# Patient Record
Sex: Female | Born: 1995 | Race: Black or African American | Hispanic: No | Marital: Single | Smoking: Never smoker
Health system: Southern US, Community
[De-identification: ages and names within clinical notes are randomized; demographics above are authoritative.]

## PROBLEM LIST (undated history)

## (undated) ENCOUNTER — Inpatient Hospital Stay (HOSPITAL_COMMUNITY): Payer: Self-pay

## (undated) DIAGNOSIS — Z789 Other specified health status: Secondary | ICD-10-CM

## (undated) HISTORY — PX: NO PAST SURGERIES: SHX2092

---
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
## 2013-09-30 ENCOUNTER — Inpatient Hospital Stay (HOSPITAL_COMMUNITY)
Admission: RE | Admit: 2013-09-30 | Discharge: 2013-09-30 | Disposition: A | Discharge status: Home / Self Care | Payer: Medicaid Other | Source: Ambulatory Visit | Attending: Obstetrics & Gynecology | Admitting: Obstetrics & Gynecology

## 2013-09-30 DIAGNOSIS — Z3202 Encounter for pregnancy test, result negative: Secondary | ICD-10-CM | POA: Insufficient documentation

## 2013-09-30 LAB — URINALYSIS, ROUTINE W REFLEX MICROSCOPIC
BILIRUBIN URINE: NEGATIVE
Glucose, UA: NEGATIVE mg/dL
HGB URINE DIPSTICK: NEGATIVE
KETONES UR: 15 mg/dL — AB
NITRITE: NEGATIVE
PH: 6 (ref 5.0–8.0)
Protein, ur: NEGATIVE mg/dL
Specific Gravity, Urine: 1.025 (ref 1.005–1.030)
Urobilinogen, UA: 1 mg/dL (ref 0.0–1.0)

## 2013-09-30 LAB — URINE MICROSCOPIC-ADD ON

## 2013-09-30 LAB — POCT PREGNANCY, URINE: Preg Test, Ur: NEGATIVE

## 2013-09-30 NOTE — MAU Provider Note (Signed)
  History     CSN: [id number]  Arrival date and time: 09/30/13 1524   None     Chief Complaint  Patient presents with  . Possible Pregnancy   Possible Pregnancy    Carrie Robles is a 18 y.o. G1P1001 who presents today for a pregnancy test. She has not other concerns at this time.   Past Medical History  Diagnosis Date  . Medical history non-contributory     Past Surgical History  Procedure Laterality Date  . No past surgeries      No family history on file.  History  Substance Use Topics  . Smoking status: Never Smoker   . Smokeless tobacco: Never Used  . Alcohol Use: No    Allergies: No Known Allergies  Prescriptions prior to admission  Medication Sig Dispense Refill  . ibuprofen (ADVIL,MOTRIN) 600 MG tablet Take 1 tablet (600 mg total) by mouth every 6 (six) hours.  30 tablet  0    ROS Physical Exam   Blood pressure 118/67, pulse 92, temperature 97.7 F (36.5 C), temperature source Oral, resp. rate 18, last menstrual period 09/21/2013, unknown if currently breastfeeding.  Physical Exam  Nursing note and vitals reviewed. Constitutional: She is oriented to person, place, and time. She appears well-developed and well-nourished. No distress.  Cardiovascular: Normal rate.   Respiratory: Effort normal.  Neurological: She is alert and oriented to person, place, and time.  Skin: Skin is warm and dry.  Psychiatric: She has a normal mood and affect.    MAU Course  Procedures  Results for orders placed during the hospital encounter of 09/30/13 (from the past 24 hour(s))  POCT PREGNANCY, URINE     Status: None   Collection Time    09/30/13  3:54 PM      Result Value Ref Range   Preg Test, Ur NEGATIVE  NEGATIVE     Assessment and Plan   1. Encounter for pregnancy test with result negative    Discussed home pregnancy tests and when to use Return to MAU as needed  Follow-up Information   [facility] [place]. (As needed)    Contact information:   [address] [place] [id number] [phone]      Tawnya CrookHogan, [name] 09/30/2013, 3:59 PM

## 2013-09-30 NOTE — Discharge Instructions (Signed)
Pregnancy Tests Pregnancy tests are usually available at most drug stores and even the [employer] HOW DO PREGNANCY TESTS WORK? All pregnancy tests look for a special hormone in the urine or blood that is only present in pregnant women. This hormone, human chorionic gonadotropin (hCG), is also called the pregnancy hormone.  WHAT IS THE DIFFERENCE BETWEEN A URINE AND A BLOOD PREGNANCY TEST? IS ONE BETTER THAN THE OTHER? There are two types of pregnancy tests.  Blood tests.  Urine tests. Both tests look for the presence of hCG, the pregnancy hormone. Many women use a urine test or home pregnancy test (HPT) to find out if they are pregnant. HPTs are cheap, easy to use, can be done at home, and are private. When a woman has a positive result on an HPT, she needs to see her caregiver right away. The caregiver can confirm a positive HPT result with another urine test, a blood test, ultrasound, and a pelvic exam.  There are two types of blood tests you can get from a caregiver.   A quantitative blood test (or the beta hCG test). This test measures the exact amount of hCG in the blood. This means it can pick up very small amounts of hCG, making it a very accurate test.  A qualitative hCG blood test. This test gives a simple yes or no answer to whether you are pregnant. This test is more like a urine test in terms of its accuracy. Blood tests can pick up hCG earlier in a pregnancy than urine tests can. Blood tests can tell if you are pregnant about 6 to 8 days after you release an egg from an ovary (ovulate). Urine tests can determine pregnancy about 2 weeks after ovulation.  HOW IS A HOME PREGNANCY TEST DONE?  There are many types of home pregnancy tests or HPTs that can be bought over-the-counter at drug or discount stores.   Some involve collecting your urine in a cup and dipping a stick into the urine or putting some of the urine into a special container with an eyedropper.  Others are done by  placing a stick into your urine stream.  Tests vary in how long you need to wait for the stick or container to turn a certain color or have a symbol on it (like a plus or a minus).  All tests come with written instructions. Most tests also have toll-free phone numbers to call if you have any questions about how to do the test or read the results. HOW ACCURATE ARE HOME PREGNANCY TESTS?  HPTs are very accurate. Most brands of HPTs say they are 97% to 99% accurate when taken 1 week after missing your menstrual period, but this can vary with actual use. Each brand varies in how sensitive it is in picking up the pregnancy hormone hCG. If a test is not done correctly, it will be less accurate. Always check the package to make sure it is not past its expiration date. If it is, it will not be accurate. Most brands of HPTs tell users to do the test again in a few days, no matter what the results.  If you use an HPT too early in your pregnancy, you may not have enough of the pregnancy hormone hCG in your urine to have a positive test result. Most HPTs will be accurate if you test yourself around the time your period is due (about 2 weeks after you ovulate). You can get a negative test result if  you are not pregnant or if you ovulated later than you thought you did. You may also have problems with the pregnancy, which affects the amount of hCG you have in your urine. If your HPT is negative, test yourself again within a few days to 1 week. If you keep getting a negative result and think you are pregnant, talk with your caregiver right away about getting a blood pregnancy test.  FALSE POSITIVE PREGNANCY TEST A false positive HPT can happen if there is blood or protein present in your urine. A false positive can also happen if you were recently pregnant or if you take a pregnancy test too soon after taking fertility drug that contains hCG. Also, some prescription medicines such as water pills (diuretics), tranquilizers,  seizure medicines, psychiatric medicines, and allergy and nausea medicines (promethazine) give false positive readings. FALSE NEGATIVE PREGNANCY TEST  A false negative HPT can happen if you do the test too early. Try to wait until you are at least 1 day late for your menstrual period.  It may happen if you wait too long to test the urine (longer than 15 minutes).  It may also happen if the urine is too diluted because you drank a lot of fluids before getting the urine sample. It is best to test the first morning urine after you get out of bed. If your menstrual period did not start after a week of a negative HPT, repeat the pregnancy test. CAN ANYTHING INTERFERE WITH HOME PREGNANCY TEST RESULTS?  Most medicines, both over-the-counter and prescription drugs, including birth control pills and antibiotics, should not affect the results of a HPT. Only those drugs that have the pregnancy hormone hCG in them can give a false positive test result. Drugs that have hCG in them may be used for treating infertility (not being able to get pregnant). Alcohol and illegal drugs do not affect HPT results, but you should not be using these substances if you are trying to get pregnant. If you have a positive pregnancy test, call your caregiver to make an appointment to begin prenatal care. Document Released: 03/08/2003 Document Revised: 05/28/2011 Document Reviewed: 05/19/2010 [facility] Patient Information 2015 [place], [place]. This information is not intended to replace advice given to you by your health care provider. Make sure you discuss any questions you have with your health care provider.

## 2013-09-30 NOTE — MAU Note (Signed)
C/O fatigue, sleeping a lot, needs a pregnancy test.  Last period was shorter than usual.

## 2013-09-30 NOTE — MAU Provider Note (Signed)
Attestation of Attending Supervision of Advanced Practitioner (CNM/NP): Evaluation and management procedures were performed by the Advanced Practitioner under my supervision and collaboration.  I have reviewed the Advanced Practitioner's note and chart, and I agree with the management and plan.  HARRAWAY-SMITH, Patriece Archbold 4:25 PM     

## 2014-01-18 ENCOUNTER — Encounter (HOSPITAL_COMMUNITY): Payer: Self-pay | Admitting: *Deleted

## 2014-08-26 IMAGING — US US OB COMP +14 WK
1 series · 13 of 28 positions shown · non-contrast
Comparison: none

[Series 1: us ob comp +14 wk · 13 of 28 slices shown]
[im 2/28]
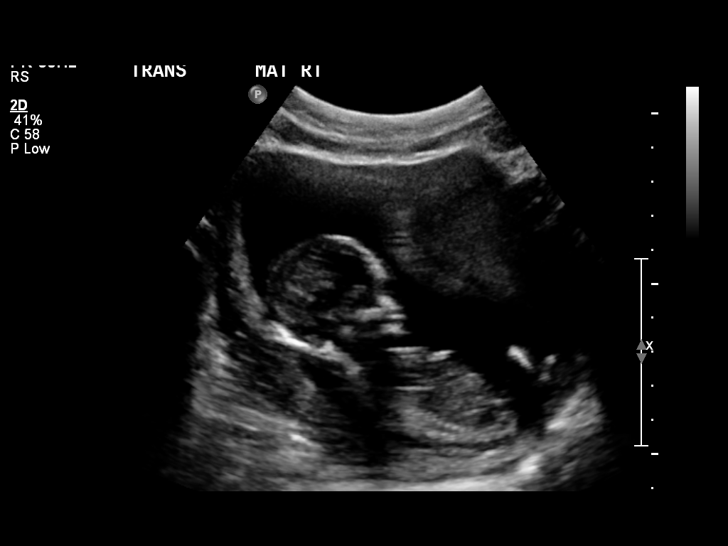
[im 4/28]
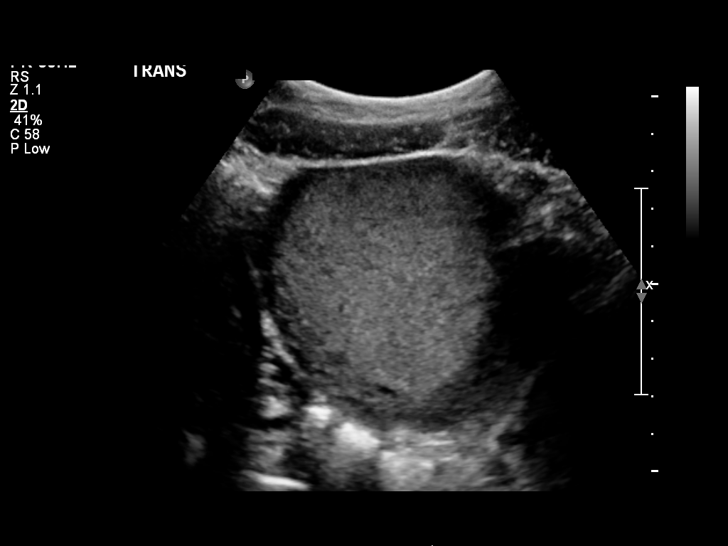
[im 6/28]
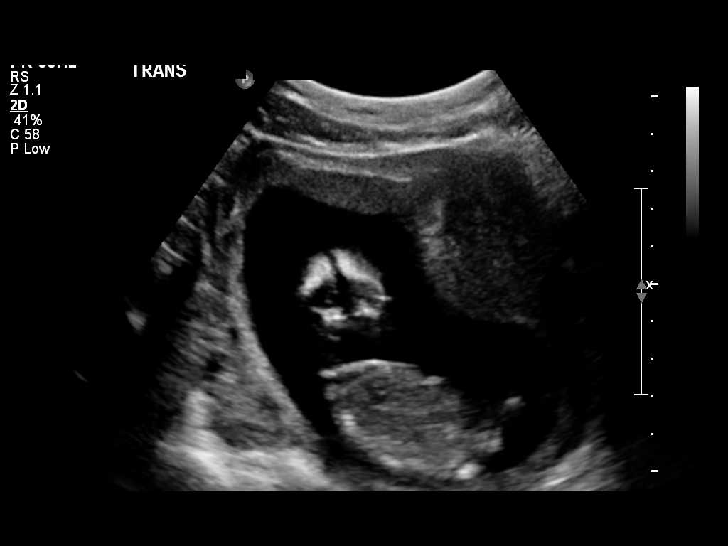
[im 8/28]
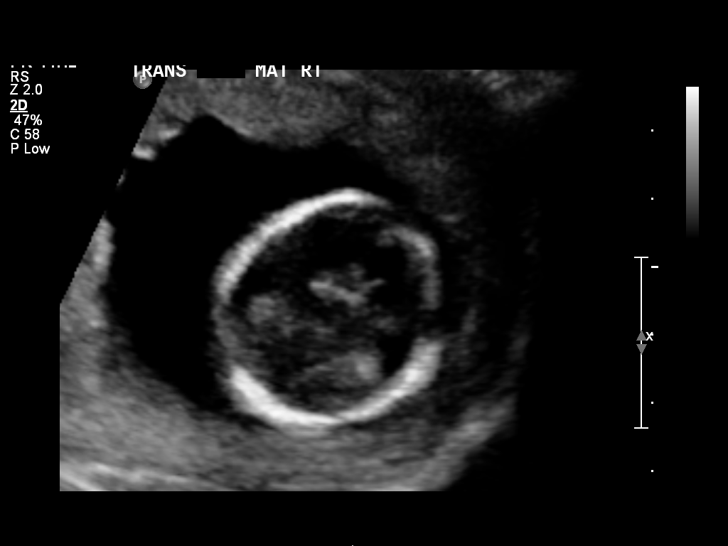
[im 10/28]
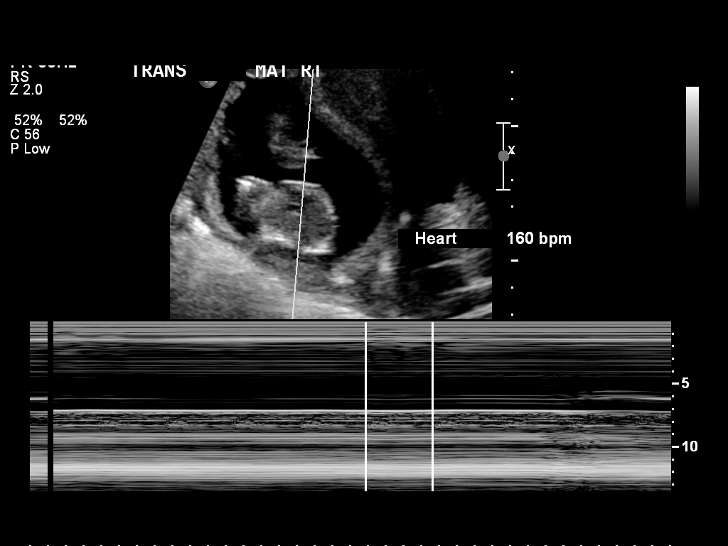
[im 12/28]
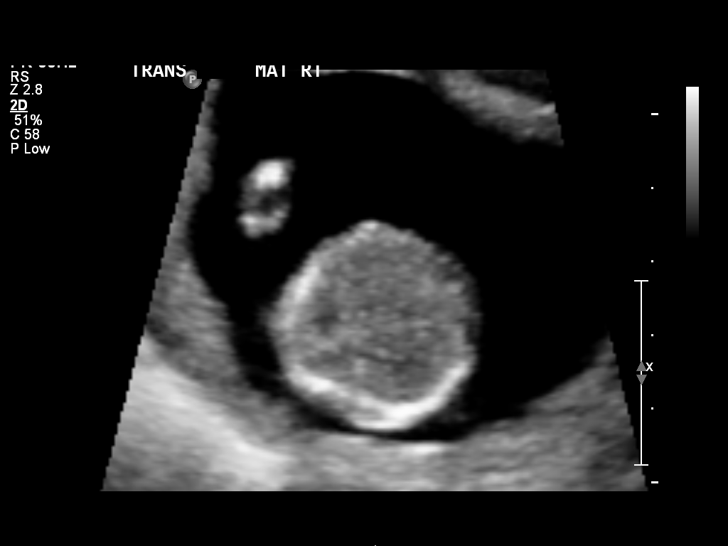
[im 15/28]
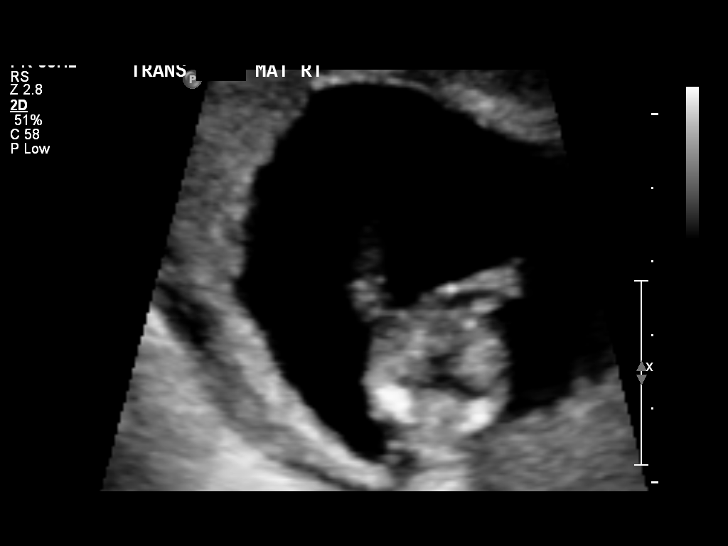
[im 17/28]
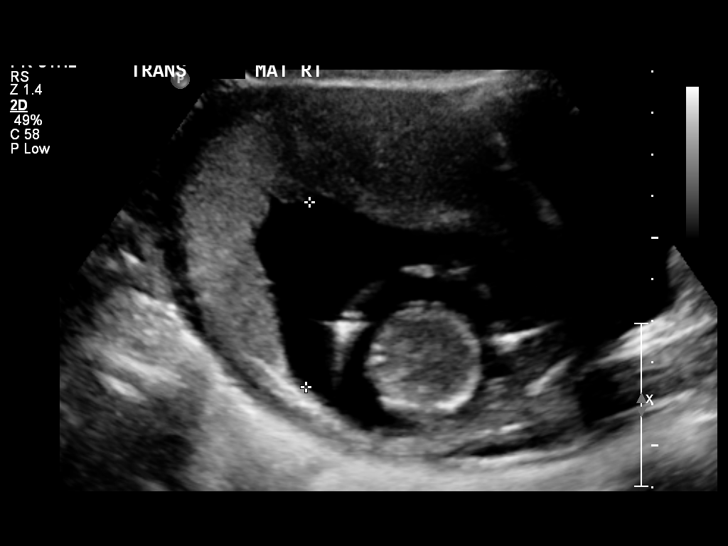
[im 19/28]
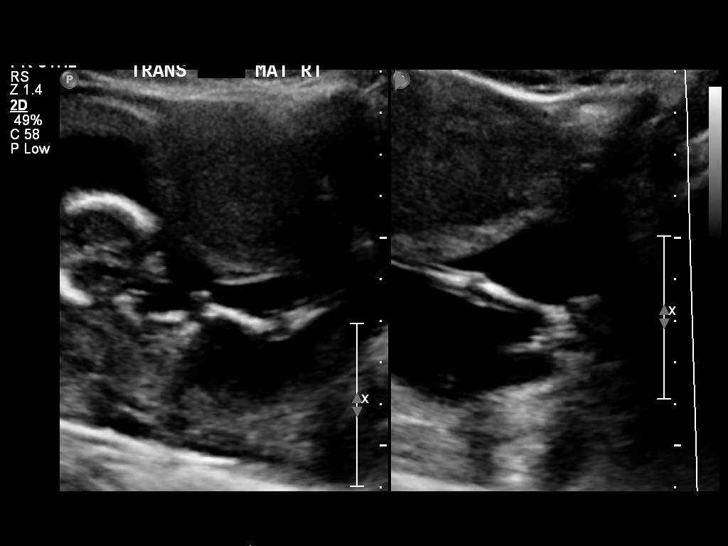
[im 21/28]
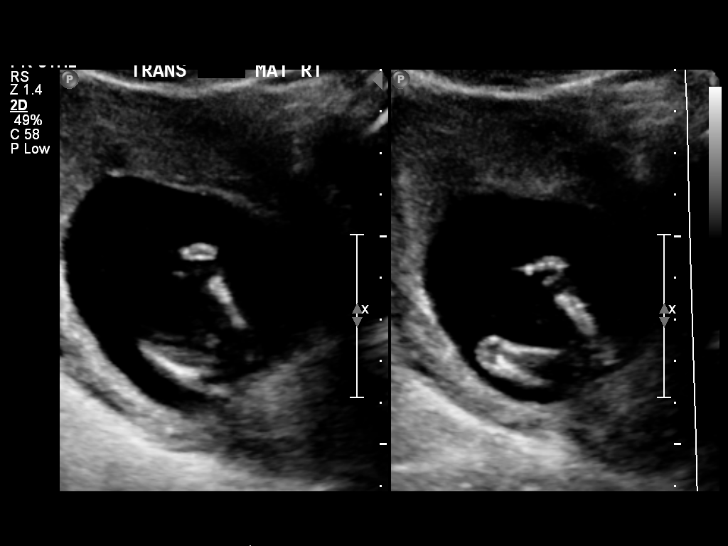
[im 23/28]
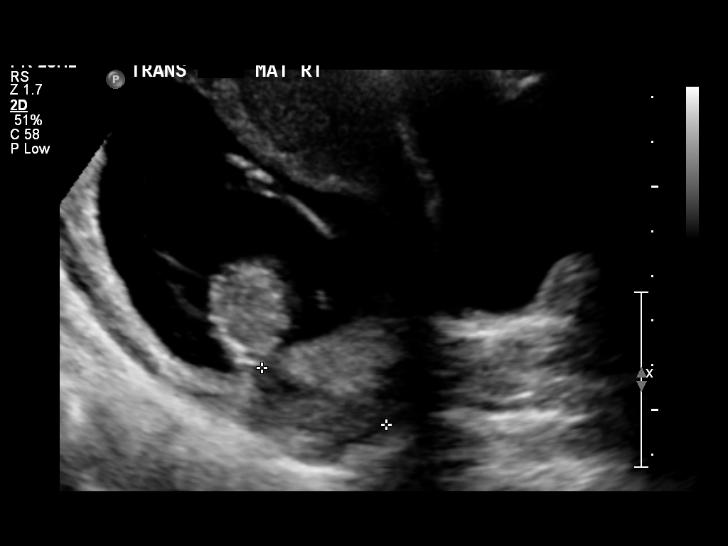
[im 25/28]
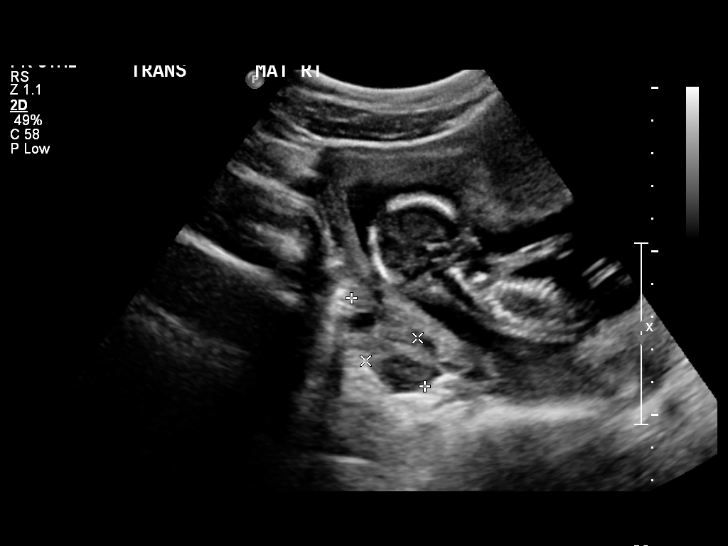
[im 27/28]
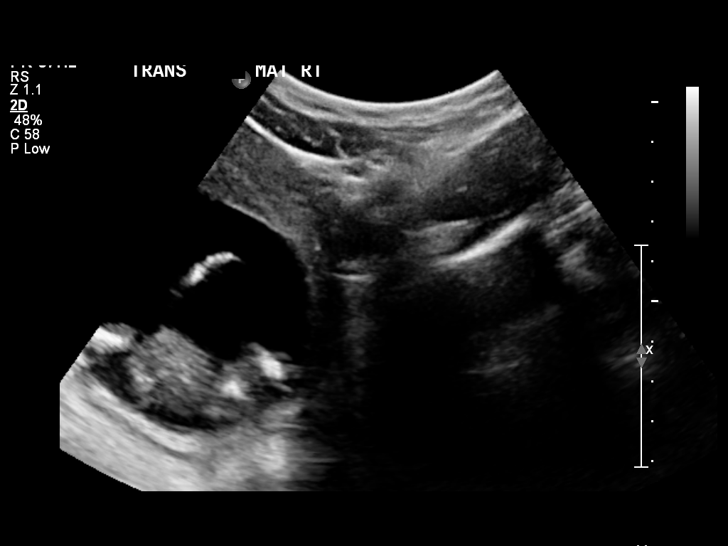

[13 of 28 positions shown; findings below may reference images not displayed]

OBSTETRICS REPORT
                      (Signed Final 04/18/2012 [DATE])

Service(s) Provided

 US OB COMP + 14 WK                                    76805.1
Indications

 Size less than dates (Small for gestational [AGE]
 FGR)
 Unsure of LMP;  Establish Gestational [AGE]
Fetal Evaluation

 Num Of Fetuses:    1
 Fetal Heart Rate:  160                         bpm
 Cardiac Activity:  Observed
 Presentation:      Transverse, head to
                    maternal right
 Placenta:          Fundal, above cervical os

 Amniotic Fluid
 AFI FV:      Subjectively within normal limits
                                             Larg Pckt:     4.4  cm
Biometry

 BPD:     29.6  mm    G. Age:   15w 3d                CI:        76.65   70 - 86
                                                      FL/HC:      14.5   15.3 -

 HC:     107.1  mm    G. Age:   15w 1d       37  %    HC/AC:      1.25   1.05 -

 AC:      85.6  mm    G. Age:   14w 6d       49  %    FL/BPD:
 FL:      15.5  mm    G. Age:   14w 4d       28  %    FL/AC:      18.1   20 - 24

 Est. FW:     106  gm      0 lb 4 oz     73  %
Gestational Age

 LMP:           19w 2d       Date:   12/05/11                 EDD:   09/10/12
 U/S Today:     15w 0d                                        EDD:   10/10/12
 Best:          15w 0d    Det. By:   U/S (04/18/12)           EDD:   10/10/12
Anatomy

 Choroid Plexus:   Appears normal         Lower             Appears normal
                                          Extremities:
 Stomach:          Appears normal, left   Upper             Appears normal
                   sided                  Extremities:
 Other:  Technically difficult due to early GA.
Cervix Uterus Adnexa

 Cervical Length:   3.1       cm

 Cervix:       Normal appearance by transabdominal scan.

 Left Ovary:   Not visualized.
 Right Ovary:  Within normal limits.
 Adnexa:     No abnormality visualized.
Impression

 Single living IUP with US Gest. Age of 15w 0d. This is behind
 LMP; recommend assigned US EDD of 10/10/2012, and
 follow-up US in 4 wks to confirm linear fetal growth.
 Suboptimal evaluation of anatomy due to early GA, but no
 early fetal anomalies visualized.  Anatomy can be
 reevaluated at time of followup US in 4 wks.
 No significant maternal uterine or adnexal abnormality
 identified.

 Thank you for sharing in the care of Ms. CHARALAMBIA SAPARILLA with
 questions or concerns.

## 2014-09-23 IMAGING — US US OB FOLLOW-UP
1 series · 12 of 28 positions shown · non-contrast
Comparison: none

[Series 1: us ob follow up · 12 of 72 slices shown]
[im 3/72]
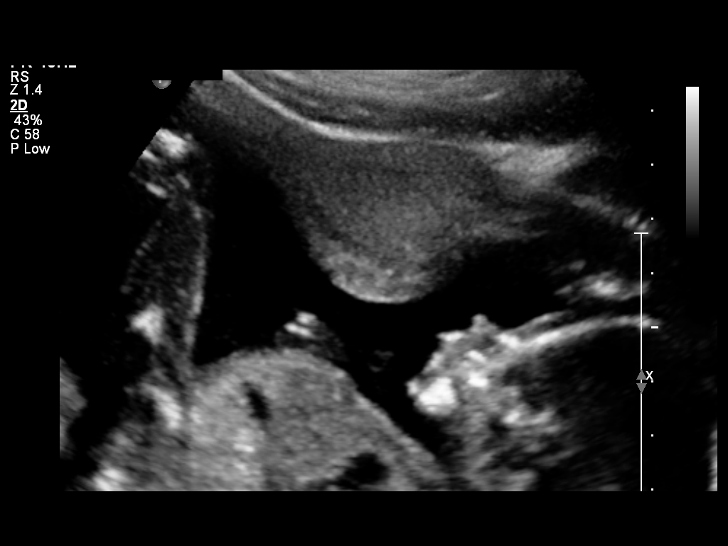
[im 8/72]
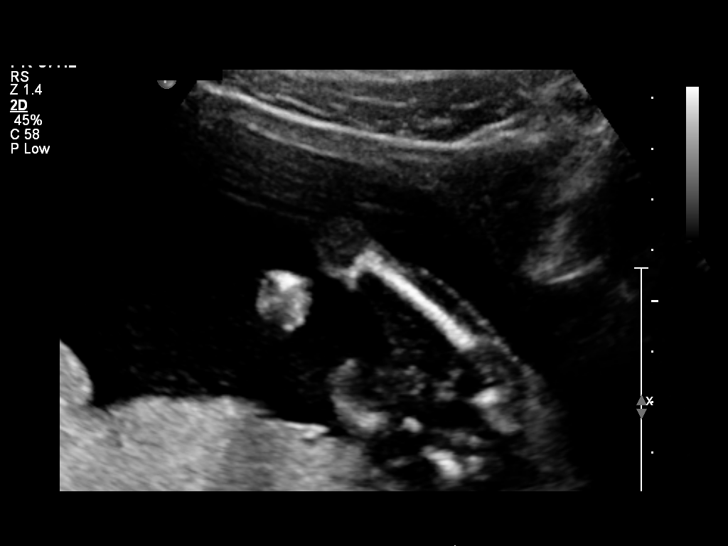
[im 14/72]
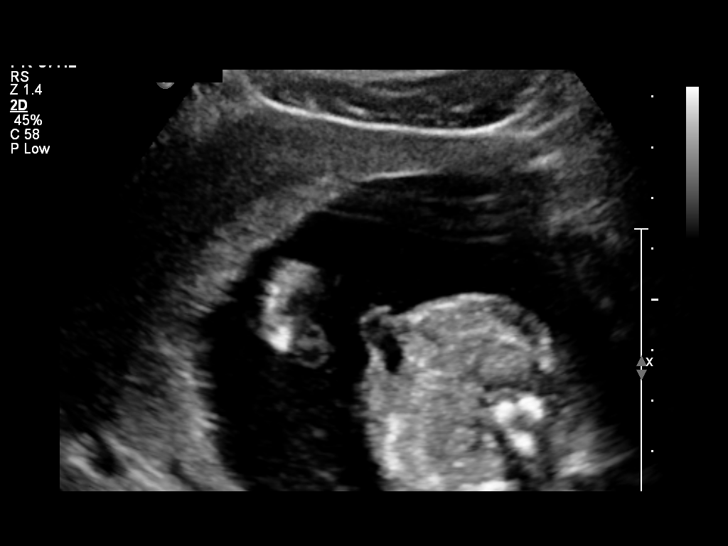
[im 22/72]
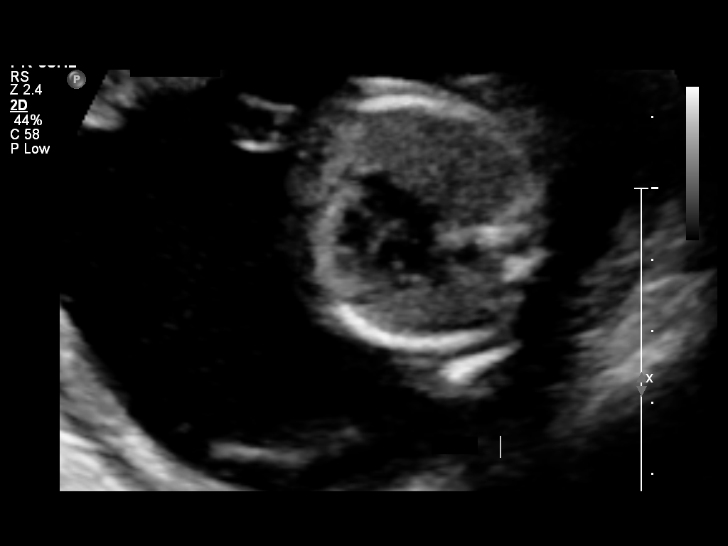
[im 27/72]
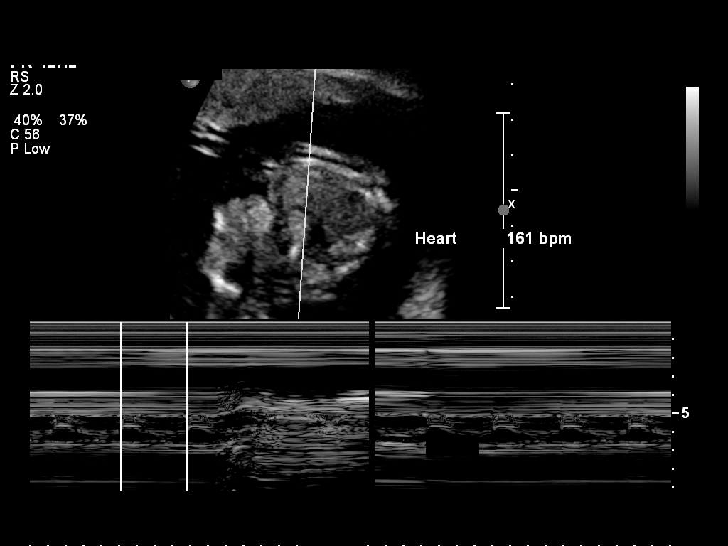
[im 32/72]
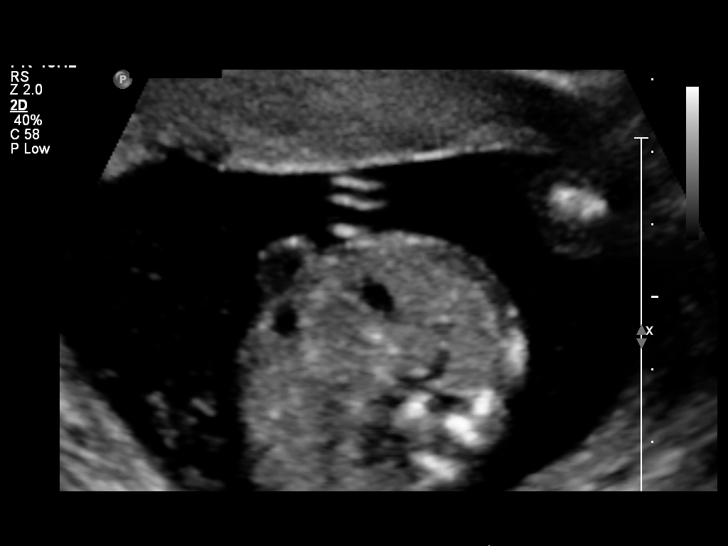
[im 40/72]
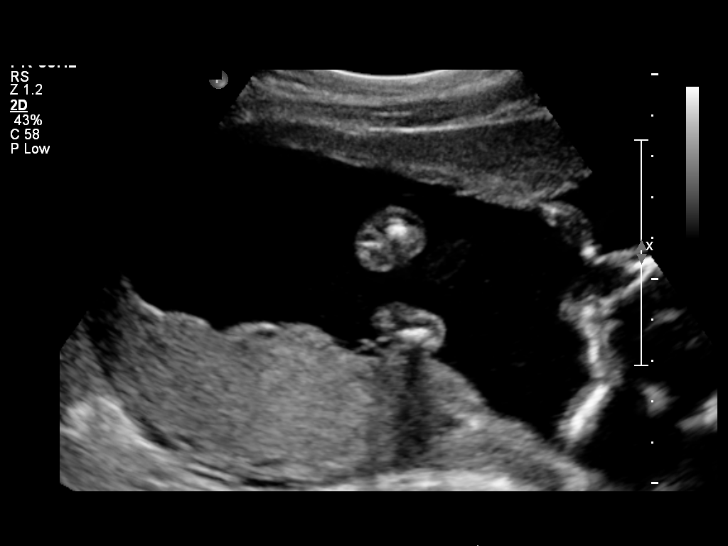
[im 45/72]
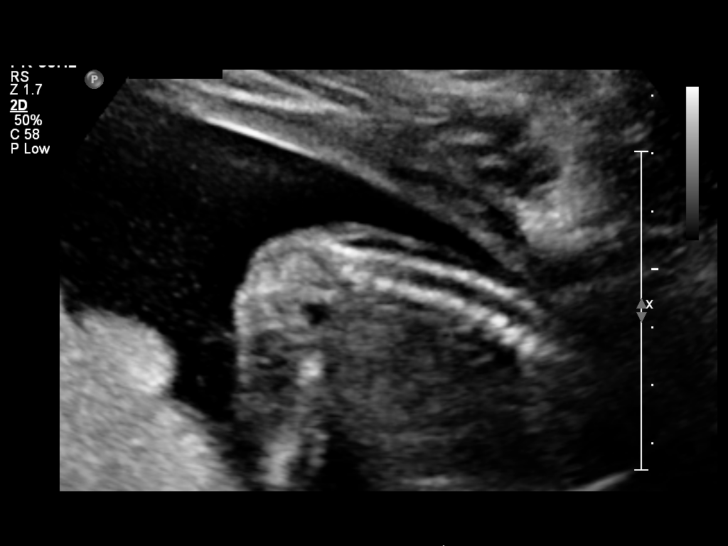
[im 50/72]
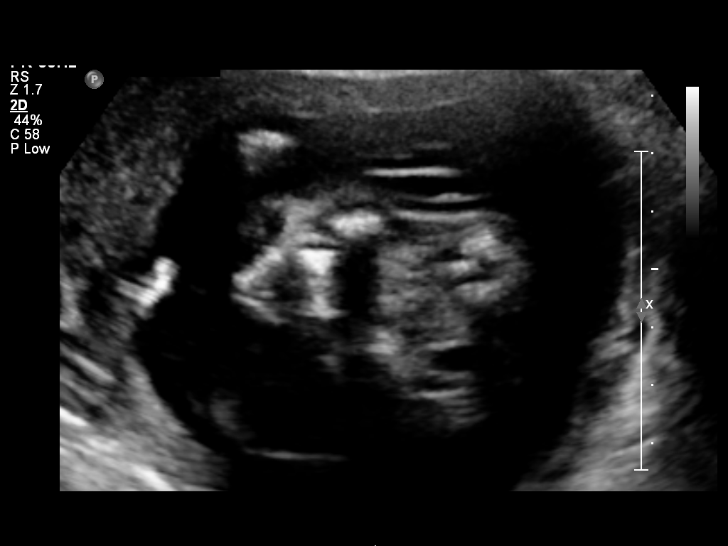
[im 58/72]
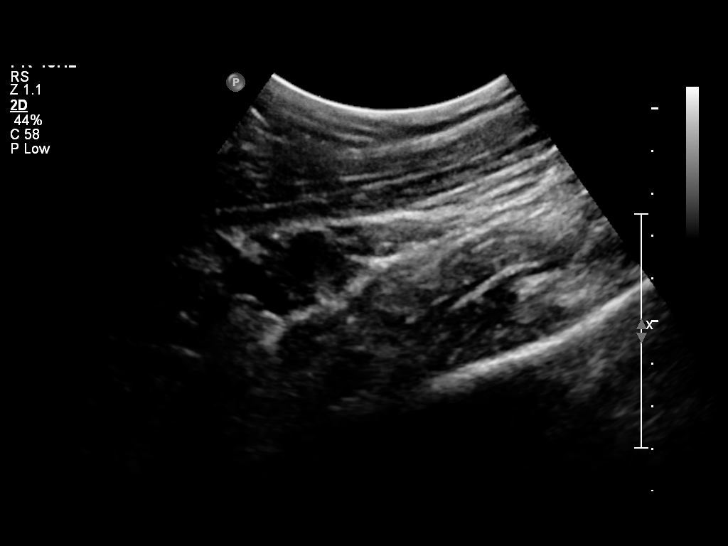
[im 64/72]
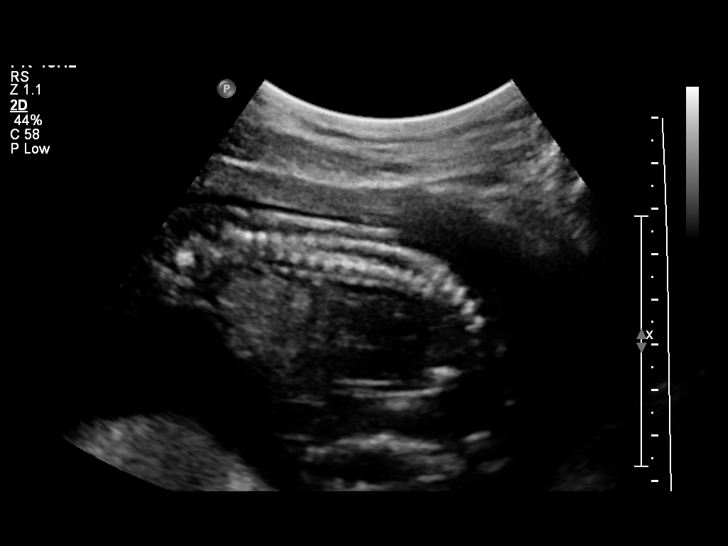
[im 69/72]
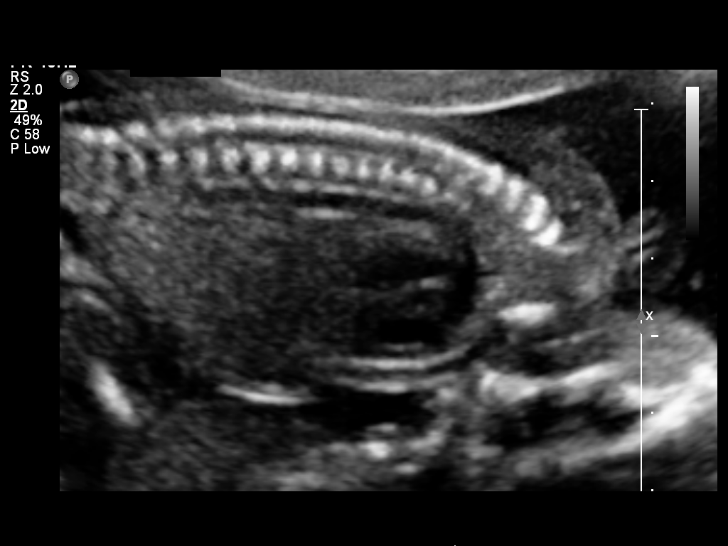

[12 of 28 positions shown; findings below may reference images not displayed]

OBSTETRICS REPORT
                      (Signed Final 05/19/2012 [DATE])

Service(s) Provided

 US OB FOLLOW UP                                       76816.1
Indications

 Follow-up incomplete fetal anatomic evaluation
Fetal Evaluation

 Num Of Fetuses:    1
 Fetal Heart Rate:  161                         bpm
 Cardiac Activity:  Observed
 Presentation:      Cephalic
 Placenta:          Posterior, above cervical
                    os
 P. Cord            Visualized, central
 Insertion:

 Amniotic Fluid
 AFI FV:      Subjectively within normal limits
                                             Larg Pckt:   5.43   cm
 LUQ:   5.43   cm
Biometry

 BPD:     42.9  mm    G. Age:   19w 0d                CI:        73.01   70 - 86
                                                      FL/HC:      18.0   16.1 -

 HC:     159.6  mm    G. Age:   18w 6d       33  %    HC/AC:      1.13   1.09 -

 AC:     141.6  mm    G. Age:   19w 4d       63  %    FL/BPD:
 FL:      28.7  mm    G. Age:   18w 6d       37  %    FL/AC:      20.3   20 - 24
 HUM:       28  mm    G. Age:   19w 0d       50  %
 CER:     18.9  mm    G. Age:   18w 3d       34  %
 NFT:     3.02  mm

 Est. FW:     277  gm    0 lb 10 oz      48  %
Gestational Age

 LMP:           23w 2d       Date:   12/05/11                 EDD:   09/10/12
 U/S Today:     19w 1d                                        EDD:   10/09/12
 Best:          19w 0d    Det. By:   U/S    (04/18/12)        EDD:   10/10/12
2nd Trimester Genetic Sonogram - Trisomy 21 Screening
 Age:                                             16          Risk=1:   885

 Structural anomalies (inc. cardiac):             No
 Echogenic bowel:                                 No
 Hypoplastic / absent midphalanx 5th Digit:       No
 Wide space 0st-3nd toes:                         No
 Pyelectasis:                                     No
 2-vessel umbilical cord:                         No
 Echogenic cardiac foci:                          No
Anatomy

 Cranium:          Appears normal         Aortic Arch:      Appears normal
 Fetal Cavum:      Appears normal         Ductal Arch:      Appears normal
 Ventricles:       Appears normal         Diaphragm:        Appears normal
 Choroid Plexus:   Appears normal         Stomach:          Appears normal
 Cerebellum:       Appears normal         Abdomen:          Appears normal
 Posterior Fossa:  Appears normal         Abdominal Wall:   Appears nml (cord
                                                            insert, abd wall)
 Nuchal Fold:      Appears normal         Cord Vessels:     Appears normal (3
                                                            vessel cord)
 Face:             Appears normal         Kidneys:          Appear normal
                   (orbits and profile)
 Lips:             Appears normal         Bladder:          Appears normal
 Heart:            Appears normal         Spine:            Appears normal
                   (4CH, axis, and
                   situs)
 RVOT:             Appears normal         Lower             Appears normal
                                          Extremities:
 LVOT:             Appears normal         Upper             Appears normal
                                          Extremities:

 Other:  Fetus appears to be a male. Heels and 5th digit visualized. Nasal
         bone visualized.
Targeted Anatomy

 Fetal Central Nervous System
 Cisterna Magna:
Cervix Uterus Adnexa

 Cervical Length:   3.06      cm

 Cervix:       Normal appearance by transabdominal scan.
 Uterus:       No abnormality visualized.

 Adnexa:     No abnormality visualized.
Impression

 Single living IUP with assigned GA of 19w 0d. Appropriate
 fetal growth since the earlier ultrasound of 04/18/2012.
 No fetal anatomic abnormality is identified.
 Normal amniotic fluid volume and cervical length.

 Thank you for sharing in the care of Ms. AHSAN PITTMAN with
 questions or concerns.
